# Patient Record
Sex: Male | Born: 1995 | Race: Black or African American | Hispanic: No | Marital: Single | Smoking: Never smoker
Health system: Southern US, Community
[De-identification: ages and names within clinical notes are randomized; demographics above are authoritative.]

## PROBLEM LIST (undated history)

## (undated) DIAGNOSIS — T7840XA Allergy, unspecified, initial encounter: Secondary | ICD-10-CM

## (undated) HISTORY — DX: Allergy, unspecified, initial encounter: T78.40XA

---
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
## 2020-03-18 ENCOUNTER — Ambulatory Visit: Payer: BC Managed Care – PPO | Admitting: Podiatry

## 2020-03-18 ENCOUNTER — Ambulatory Visit (INDEPENDENT_AMBULATORY_CARE_PROVIDER_SITE_OTHER): Payer: BC Managed Care – PPO

## 2020-03-18 ENCOUNTER — Other Ambulatory Visit: Payer: Self-pay

## 2020-03-18 ENCOUNTER — Encounter: Payer: Self-pay | Admitting: Podiatry

## 2020-03-18 ENCOUNTER — Other Ambulatory Visit: Payer: Self-pay | Admitting: Podiatry

## 2020-03-18 DIAGNOSIS — S90211A Contusion of right great toe with damage to nail, initial encounter: Secondary | ICD-10-CM

## 2020-03-18 DIAGNOSIS — M79671 Pain in right foot: Secondary | ICD-10-CM

## 2020-03-18 DIAGNOSIS — S90219A Contusion of unspecified great toe with damage to nail, initial encounter: Secondary | ICD-10-CM

## 2020-03-18 NOTE — Progress Notes (Signed)
Subjective:  Patient ID: Alan Whitaker, male    DOB: January 25, 1996,  MRN: [id number]  Chief Complaint  Patient presents with  . Toe Pain    Pt states "I think I hit my toe 2-3 months ago and I have recently possibly injured it playing basketball, there is pain, purple discoloration, swelling, and tightness in the toe".    24 y.o. male presents with the above complaint.  Patient presents with right hallux nail contusion.  Patient states that there is mild pain and swelling and purple discoloration associated with it.  Patient states he hit the toe 2 to 3 months ago.  He does not have as much pain now but he is worried that his toenail is starting to become a little bit loose and there is discoloration associated with it.  He states he plays a lot of basketball and needle recently pain few days ago.  He has not tried anything else for this.  He denies any other acute treatment options for this.   Review of Systems: Negative except as noted in the HPI. Denies N/V/F/Ch.  Past Medical History:  Diagnosis Date  . Allergy     Current Outpatient Medications:  .  cetirizine (ZYRTEC) 10 MG tablet, Take 10 mg by mouth daily.  , Disp: , Rfl:  .  montelukast (SINGULAIR) 5 MG chewable tablet, Chew 1 tablet (5 mg total) by mouth at bedtime., Disp: 30 tablet, Rfl: 3 .  PATADAY 0.2 % SOLN, USE ONE DROP TO THE EFFECTED EYE ONCE A DAY AS NEEDED FOR ALLERGIES., Disp: 2.5 mL, Rfl: 12 .  fluticasone (FLONASE) 50 MCG/ACT nasal spray, Place 2 sprays into the nose daily., Disp: 16 g, Rfl: 2  Social History   Tobacco Use  Smoking Status Never Smoker  Smokeless Tobacco Never Used    No Known Allergies Objective:  There were no vitals filed for this visit. There is no height or weight on file to calculate BMI. Constitutional Well developed. Well nourished.  Vascular Dorsalis pedis pulses palpable bilaterally. Posterior tibial pulses palpable bilaterally. Capillary refill normal to all digits.  No  cyanosis or clubbing noted. Pedal hair growth normal.  Neurologic Normal speech. Oriented to person, place, and time. Epicritic sensation to light touch grossly present bilaterally.  Dermatologic Nails well groomed and normal in appearance. No open wounds. No skin lesions.  Orthopedic:  Mild pain on palpation across the entire nail to the right hallux.  Yellowish discoloration noted across the nail.  No suspicion of fungus noted.  Small ridging noted on the right hallux nail.  No hematoma was noted.  No other clinical signs of infection was noted.   Radiographs: 3 views of skeletally mature adult foot right: No bony abnormalities noted.  No signs of fractures or radiolucency noted.  No arthritic changes noted.  Pes planus deformity noted bilaterally   Assessment:   1. Pain in right foot   2. Contusion of great toe with damage to nail, initial encounter    Plan:  Patient was evaluated and treated and all questions answered.  Right hallux nail contusion with mild damage to the nail -I explained to the patient the etiology of nail contusion and likely due to microtrauma from shoes constantly hitting against the top of the nail.  I believe that this likely led to the pain that is associated especially while playing basketball.  I have asked the patient to make shoe gear modification with wider toe box shoes. -At this time I am  not clinically concerned for any signs of infection or fungal involvement.  However I am not able to rule out fungus completely as there is always a component of it given that there is yellowish discoloration to the nail itself.  Patient states understanding and he will try to make shoe gear modification.  If it does not improve he will come back and see me and we will reevaluate in 8 weeks.   Return in about 8 weeks (around 05/13/2020) for Right 1st toe contusion.

## 2020-05-16 ENCOUNTER — Ambulatory Visit: Payer: BC Managed Care – PPO | Attending: Internal Medicine

## 2020-05-16 DIAGNOSIS — Z23 Encounter for immunization: Secondary | ICD-10-CM

## 2020-05-16 NOTE — Progress Notes (Signed)
   Covid-19 Vaccination Clinic  Name:  Alan Whitaker    MRN: [id number] DOB: [birth year]  05/16/2020  Mr. [name] was observed post Covid-19 immunization for 15 minutes without incident. He was provided with Vaccine Information Sheet and instruction to access the V-Safe system.   Mr. [name] was instructed to call 911 with any severe reactions post vaccine: Marland Kitchen Difficulty breathing  . Swelling of face and throat  . A fast heartbeat  . A bad rash all over body  . Dizziness and weakness   Immunizations Administered    Name Date Dose VIS Date Route   Pfizer COVID-19 Vaccine 05/16/2020  2:58 PM 0.3 mL 02/24/2019 Intramuscular   Manufacturer: [employer], [employer]   Lot: [id number]   NDC: [address]-0

## 2020-05-25 ENCOUNTER — Ambulatory Visit: Payer: BC Managed Care – PPO | Admitting: Podiatry

## 2020-06-06 ENCOUNTER — Ambulatory Visit: Payer: BC Managed Care – PPO | Attending: Internal Medicine

## 2020-06-06 DIAGNOSIS — Z23 Encounter for immunization: Secondary | ICD-10-CM

## 2020-06-06 NOTE — Progress Notes (Signed)
° °  Covid-19 Vaccination Clinic  Name:  [place] L Lecount    MRN: [id number] DOB: [birth year]  06/06/2020  Mr. Altizer was observed post Covid-19 immunization for 15 minutes without incident. He was provided with Vaccine Information Sheet and instruction to access the V-Safe system.   Mr. Goostree was instructed to call 911 with any severe reactions post vaccine:  Difficulty breathing   Swelling of face and throat   A fast heartbeat   A bad rash all over body   Dizziness and weakness   Immunizations Administered    Name Date Dose VIS Date Route   Pfizer COVID-19 Vaccine 06/06/2020  2:41 PM 0.3 mL 02/24/2019 Intramuscular   Manufacturer: [employer], [employer]   Lot: [id number]   NDC: [address]-4
